# Patient Record
Sex: Female | Born: 1965
Health system: Southern US, Community
[De-identification: ages and names within clinical notes are randomized; demographics above are authoritative.]

---

## 2012-03-08 ENCOUNTER — Other Ambulatory Visit: Payer: Self-pay | Admitting: Internal Medicine

## 2012-03-08 DIAGNOSIS — Z1231 Encounter for screening mammogram for malignant neoplasm of breast: Secondary | ICD-10-CM

## 2012-04-24 ENCOUNTER — Ambulatory Visit
Admission: RE | Admit: 2012-04-24 | Discharge: 2012-04-24 | Disposition: A | Discharge status: Home / Self Care | Payer: Self-pay | Source: Ambulatory Visit | Attending: Internal Medicine | Admitting: Internal Medicine

## 2012-04-24 DIAGNOSIS — Z1231 Encounter for screening mammogram for malignant neoplasm of breast: Secondary | ICD-10-CM

## 2012-04-29 ENCOUNTER — Other Ambulatory Visit: Payer: Self-pay | Admitting: Internal Medicine

## 2012-04-29 DIAGNOSIS — R928 Other abnormal and inconclusive findings on diagnostic imaging of breast: Secondary | ICD-10-CM

## 2012-06-03 ENCOUNTER — Ambulatory Visit
Admission: RE | Admit: 2012-06-03 | Discharge: 2012-06-03 | Disposition: A | Discharge status: Home / Self Care | Payer: Commercial Managed Care - PPO | Source: Ambulatory Visit | Attending: Internal Medicine | Admitting: Internal Medicine

## 2012-06-03 ENCOUNTER — Other Ambulatory Visit: Payer: Self-pay | Admitting: Internal Medicine

## 2012-06-03 DIAGNOSIS — R928 Other abnormal and inconclusive findings on diagnostic imaging of breast: Secondary | ICD-10-CM

## 2012-10-04 ENCOUNTER — Other Ambulatory Visit: Payer: Self-pay | Admitting: Obstetrics and Gynecology

## 2012-10-04 ENCOUNTER — Other Ambulatory Visit (HOSPITAL_COMMUNITY)
Admission: RE | Admit: 2012-10-04 | Discharge: 2012-10-04 | Disposition: A | Discharge status: Home / Self Care | Payer: Commercial Managed Care - PPO | Source: Ambulatory Visit | Attending: Obstetrics and Gynecology | Admitting: Obstetrics and Gynecology

## 2012-10-04 DIAGNOSIS — Z1151 Encounter for screening for human papillomavirus (HPV): Secondary | ICD-10-CM | POA: Insufficient documentation

## 2012-10-04 DIAGNOSIS — Z01419 Encounter for gynecological examination (general) (routine) without abnormal findings: Secondary | ICD-10-CM | POA: Insufficient documentation

## 2013-06-27 ENCOUNTER — Other Ambulatory Visit: Payer: Self-pay

## 2013-06-27 DIAGNOSIS — Z1231 Encounter for screening mammogram for malignant neoplasm of breast: Secondary | ICD-10-CM

## 2013-07-21 ENCOUNTER — Ambulatory Visit
Admission: RE | Admit: 2013-07-21 | Discharge: 2013-07-21 | Disposition: A | Discharge status: Home / Self Care | Payer: Commercial Managed Care - PPO | Source: Ambulatory Visit

## 2013-07-21 DIAGNOSIS — Z1231 Encounter for screening mammogram for malignant neoplasm of breast: Secondary | ICD-10-CM

## 2013-12-30 ENCOUNTER — Other Ambulatory Visit: Payer: Self-pay | Admitting: Obstetrics and Gynecology

## 2013-12-30 ENCOUNTER — Other Ambulatory Visit (HOSPITAL_COMMUNITY)
Admission: RE | Admit: 2013-12-30 | Discharge: 2013-12-30 | Disposition: A | Discharge status: Home / Self Care | Payer: BC Managed Care – PPO | Source: Ambulatory Visit | Attending: Obstetrics and Gynecology | Admitting: Obstetrics and Gynecology

## 2013-12-30 DIAGNOSIS — Z01419 Encounter for gynecological examination (general) (routine) without abnormal findings: Secondary | ICD-10-CM | POA: Diagnosis not present

## 2013-12-31 LAB — CYTOLOGY - PAP

## 2014-01-08 ENCOUNTER — Ambulatory Visit
Admission: RE | Admit: 2014-01-08 | Discharge: 2014-01-08 | Disposition: A | Discharge status: Home / Self Care | Payer: BC Managed Care – PPO | Source: Ambulatory Visit | Attending: Internal Medicine | Admitting: Internal Medicine

## 2014-01-08 ENCOUNTER — Other Ambulatory Visit: Payer: Self-pay | Admitting: Internal Medicine

## 2014-01-08 DIAGNOSIS — M7989 Other specified soft tissue disorders: Secondary | ICD-10-CM

## 2014-07-17 ENCOUNTER — Other Ambulatory Visit: Payer: Self-pay

## 2014-07-17 DIAGNOSIS — Z1231 Encounter for screening mammogram for malignant neoplasm of breast: Secondary | ICD-10-CM

## 2014-08-26 ENCOUNTER — Ambulatory Visit
Admission: RE | Admit: 2014-08-26 | Discharge: 2014-08-26 | Disposition: A | Discharge status: Home / Self Care | Payer: BLUE CROSS/BLUE SHIELD | Source: Ambulatory Visit

## 2014-08-26 DIAGNOSIS — Z1231 Encounter for screening mammogram for malignant neoplasm of breast: Secondary | ICD-10-CM

## 2014-12-15 ENCOUNTER — Other Ambulatory Visit: Payer: Self-pay | Admitting: Obstetrics and Gynecology

## 2014-12-15 ENCOUNTER — Other Ambulatory Visit (HOSPITAL_COMMUNITY)
Admission: RE | Admit: 2014-12-15 | Discharge: 2014-12-15 | Disposition: A | Discharge status: Home / Self Care | Payer: BLUE CROSS/BLUE SHIELD | Source: Ambulatory Visit | Attending: Obstetrics and Gynecology | Admitting: Obstetrics and Gynecology

## 2014-12-15 DIAGNOSIS — Z01419 Encounter for gynecological examination (general) (routine) without abnormal findings: Secondary | ICD-10-CM | POA: Insufficient documentation

## 2014-12-17 LAB — CYTOLOGY - PAP

## 2015-07-29 ENCOUNTER — Other Ambulatory Visit: Payer: Self-pay

## 2015-07-29 ENCOUNTER — Other Ambulatory Visit (HOSPITAL_BASED_OUTPATIENT_CLINIC_OR_DEPARTMENT_OTHER): Payer: Self-pay

## 2015-07-29 DIAGNOSIS — Z1231 Encounter for screening mammogram for malignant neoplasm of breast: Secondary | ICD-10-CM

## 2015-09-14 ENCOUNTER — Ambulatory Visit
Admission: RE | Admit: 2015-09-14 | Discharge: 2015-09-14 | Disposition: A | Discharge status: Home / Self Care | Payer: BLUE CROSS/BLUE SHIELD | Source: Ambulatory Visit

## 2015-09-14 DIAGNOSIS — Z1231 Encounter for screening mammogram for malignant neoplasm of breast: Secondary | ICD-10-CM

## 2015-11-03 ENCOUNTER — Ambulatory Visit
Admission: RE | Admit: 2015-11-03 | Discharge: 2015-11-03 | Disposition: A | Discharge status: Home / Self Care | Payer: BLUE CROSS/BLUE SHIELD | Source: Ambulatory Visit | Attending: Internal Medicine | Admitting: Internal Medicine

## 2015-11-03 ENCOUNTER — Other Ambulatory Visit: Payer: Self-pay | Admitting: Internal Medicine

## 2015-11-03 DIAGNOSIS — M25552 Pain in left hip: Secondary | ICD-10-CM

## 2015-11-05 ENCOUNTER — Ambulatory Visit
Admission: RE | Admit: 2015-11-05 | Discharge: 2015-11-05 | Disposition: A | Discharge status: Home / Self Care | Payer: BLUE CROSS/BLUE SHIELD | Source: Ambulatory Visit | Attending: Internal Medicine | Admitting: Internal Medicine

## 2015-11-05 ENCOUNTER — Other Ambulatory Visit: Payer: Self-pay | Admitting: Internal Medicine

## 2015-11-05 DIAGNOSIS — M25552 Pain in left hip: Secondary | ICD-10-CM

## 2015-12-17 ENCOUNTER — Other Ambulatory Visit: Payer: Self-pay | Admitting: Obstetrics and Gynecology

## 2015-12-17 ENCOUNTER — Other Ambulatory Visit (HOSPITAL_COMMUNITY)
Admission: RE | Admit: 2015-12-17 | Discharge: 2015-12-17 | Disposition: A | Discharge status: Home / Self Care | Payer: BLUE CROSS/BLUE SHIELD | Source: Ambulatory Visit | Attending: Obstetrics and Gynecology | Admitting: Obstetrics and Gynecology

## 2015-12-17 DIAGNOSIS — Z1151 Encounter for screening for human papillomavirus (HPV): Secondary | ICD-10-CM | POA: Insufficient documentation

## 2015-12-17 DIAGNOSIS — Z01419 Encounter for gynecological examination (general) (routine) without abnormal findings: Secondary | ICD-10-CM | POA: Insufficient documentation

## 2015-12-21 LAB — CYTOLOGY - PAP

## 2016-09-13 ENCOUNTER — Ambulatory Visit: Payer: BLUE CROSS/BLUE SHIELD

## 2016-09-13 ENCOUNTER — Other Ambulatory Visit: Payer: Self-pay | Admitting: Internal Medicine

## 2016-09-13 DIAGNOSIS — Z1231 Encounter for screening mammogram for malignant neoplasm of breast: Secondary | ICD-10-CM

## 2016-09-14 ENCOUNTER — Ambulatory Visit
Admission: RE | Admit: 2016-09-14 | Discharge: 2016-09-14 | Disposition: A | Discharge status: Home / Self Care | Payer: BLUE CROSS/BLUE SHIELD | Source: Ambulatory Visit | Attending: Internal Medicine | Admitting: Internal Medicine

## 2016-09-14 DIAGNOSIS — Z1231 Encounter for screening mammogram for malignant neoplasm of breast: Secondary | ICD-10-CM

## 2016-09-18 ENCOUNTER — Other Ambulatory Visit: Payer: Self-pay | Admitting: Internal Medicine

## 2016-09-18 DIAGNOSIS — R928 Other abnormal and inconclusive findings on diagnostic imaging of breast: Secondary | ICD-10-CM

## 2016-09-26 ENCOUNTER — Other Ambulatory Visit: Payer: Self-pay | Admitting: Obstetrics and Gynecology

## 2016-09-26 DIAGNOSIS — R928 Other abnormal and inconclusive findings on diagnostic imaging of breast: Secondary | ICD-10-CM

## 2016-09-28 ENCOUNTER — Other Ambulatory Visit: Payer: BLUE CROSS/BLUE SHIELD

## 2016-10-05 ENCOUNTER — Ambulatory Visit (HOSPITAL_COMMUNITY)
Admission: RE | Admit: 2016-10-05 | Discharge: 2016-10-05 | Disposition: A | Discharge status: Home / Self Care | Payer: Self-pay | Source: Ambulatory Visit | Attending: Obstetrics and Gynecology | Admitting: Obstetrics and Gynecology

## 2016-10-05 ENCOUNTER — Encounter (HOSPITAL_COMMUNITY): Payer: Self-pay | Admitting: *Deleted

## 2016-10-05 ENCOUNTER — Ambulatory Visit
Admission: RE | Admit: 2016-10-05 | Discharge: 2016-10-05 | Disposition: A | Discharge status: Home / Self Care | Payer: No Typology Code available for payment source | Source: Ambulatory Visit | Attending: Obstetrics and Gynecology | Admitting: Obstetrics and Gynecology

## 2016-10-05 VITALS — BP 114/72 | Ht 66.0 in | Wt 147.2 lb

## 2016-10-05 DIAGNOSIS — R928 Other abnormal and inconclusive findings on diagnostic imaging of breast: Secondary | ICD-10-CM

## 2016-10-05 DIAGNOSIS — Z1239 Encounter for other screening for malignant neoplasm of breast: Secondary | ICD-10-CM

## 2016-10-05 NOTE — Patient Instructions (Addendum)
Explained breast self awareness with Kristen Castro. Patient did not need a Pap smear today due to last Pap smear and HPV typing was 12/17/2015. Let her know BCCCP will cover Pap smears and HPV typing every 5 years unless has a history of abnormal Pap smears. [facility] for diagnostic mammogram and possible left breast ultrasound per recommendation. Appointment scheduled for Thursday, October 05, 2016 at 1410. Discussed smoking cessation with patient. Referred [facility] to the [facility] and gave resources to the free smoking cessation classes at [facility]. Kristen Castro verbalized understanding.  Brannock, Kathaleen Maserhristine Poll, RN 1:27 PM

## 2016-10-05 NOTE — Addendum Note (Signed)
Encounter addended by: Priscille HeidelbergBrannock, Marlyne Totaro P, RN on: 10/05/2016  1:32 PM<BR>    Actions taken: Sign clinical note

## 2016-10-05 NOTE — Progress Notes (Signed)
[facility] due to recommending additional imaging of the left breast. Screening mammogram completed 09/14/2016.  Pap Smear: Pap smear not completed today. Last Pap smear was 12/17/2015 at [facility] and normal with negative HPV. Per patient has a history of an abnormal Pap smear when she was 51 years old that a colposcopy was completed for follow. Per patient all Pap smears have been normal since colposcopy. Last four Pap smear results are in EPIC.  Physical exam: Breasts Breasts symmetrical. No skin abnormalities bilateral breasts. No nipple retraction bilateral breasts. No nipple discharge bilateral breasts. No lymphadenopathy. No lumps palpated bilateral breasts. No complaints of pain or tenderness on exam. [facility] for diagnostic mammogram and possible left breast ultrasound per recommendation. Appointment scheduled for Thursday, October 05, 2016 at 1410.        Pelvic/Bimanual No Pap smear completed today since last Pap smear and HPV typing was 12/17/2015. Pap smear not indicated per BCCCP guidelines.   Smoking History: Patient is a current smoker. Discussed smoking cessation with patient. [facility] and gave resources to the free smoking cessation classes at [facility].  Patient Navigation: Patient education provided. Access to services provided for patient through [facility] program.

## 2016-10-06 ENCOUNTER — Encounter (HOSPITAL_COMMUNITY): Payer: Self-pay | Admitting: *Deleted

## 2017-09-26 ENCOUNTER — Other Ambulatory Visit: Payer: Self-pay | Admitting: Internal Medicine

## 2017-09-26 DIAGNOSIS — Z1231 Encounter for screening mammogram for malignant neoplasm of breast: Secondary | ICD-10-CM

## 2017-09-28 DIAGNOSIS — Z0000 Encounter for general adult medical examination without abnormal findings: Secondary | ICD-10-CM | POA: Diagnosis not present

## 2017-09-28 DIAGNOSIS — Z13220 Encounter for screening for lipoid disorders: Secondary | ICD-10-CM | POA: Diagnosis not present

## 2017-10-17 DIAGNOSIS — Z01411 Encounter for gynecological examination (general) (routine) with abnormal findings: Secondary | ICD-10-CM | POA: Diagnosis not present

## 2017-10-17 DIAGNOSIS — N939 Abnormal uterine and vaginal bleeding, unspecified: Secondary | ICD-10-CM | POA: Diagnosis not present

## 2017-10-22 ENCOUNTER — Ambulatory Visit
Admission: RE | Admit: 2017-10-22 | Discharge: 2017-10-22 | Disposition: A | Discharge status: Home / Self Care | Payer: 59 | Source: Ambulatory Visit | Attending: Internal Medicine | Admitting: Internal Medicine

## 2017-10-22 DIAGNOSIS — Z1231 Encounter for screening mammogram for malignant neoplasm of breast: Secondary | ICD-10-CM | POA: Diagnosis not present

## 2017-11-02 DIAGNOSIS — R030 Elevated blood-pressure reading, without diagnosis of hypertension: Secondary | ICD-10-CM | POA: Diagnosis not present

## 2017-11-02 DIAGNOSIS — L409 Psoriasis, unspecified: Secondary | ICD-10-CM | POA: Diagnosis not present

## 2017-11-06 DIAGNOSIS — N939 Abnormal uterine and vaginal bleeding, unspecified: Secondary | ICD-10-CM | POA: Diagnosis not present

## 2017-11-22 DIAGNOSIS — Z1211 Encounter for screening for malignant neoplasm of colon: Secondary | ICD-10-CM | POA: Diagnosis not present

## 2018-02-20 DIAGNOSIS — L400 Psoriasis vulgaris: Secondary | ICD-10-CM | POA: Diagnosis not present

## 2018-03-18 DIAGNOSIS — K52832 Lymphocytic colitis: Secondary | ICD-10-CM | POA: Diagnosis not present

## 2018-03-18 DIAGNOSIS — K6282 Dysplasia of anus: Secondary | ICD-10-CM | POA: Diagnosis not present

## 2018-03-18 DIAGNOSIS — Z1211 Encounter for screening for malignant neoplasm of colon: Secondary | ICD-10-CM | POA: Diagnosis not present

## 2018-03-18 DIAGNOSIS — K6289 Other specified diseases of anus and rectum: Secondary | ICD-10-CM | POA: Diagnosis not present

## 2018-05-03 DIAGNOSIS — K52832 Lymphocytic colitis: Secondary | ICD-10-CM | POA: Diagnosis not present

## 2018-05-03 DIAGNOSIS — K580 Irritable bowel syndrome with diarrhea: Secondary | ICD-10-CM | POA: Diagnosis not present

## 2018-05-03 DIAGNOSIS — D013 Carcinoma in situ of anus and anal canal: Secondary | ICD-10-CM | POA: Diagnosis not present

## 2018-05-13 IMAGING — CR DG FEMUR 2+V*L*
4 series · 4 of 4 positions shown · non-contrast
Comparison: Left hip x-rays 11/03/2015.

CLINICAL DATA: 49-year-old with lateral left hip pain after
sleeping on the left side last night.

EXAM:
LEFT FEMUR 2 VIEWS

[t femur with hip  ap left]
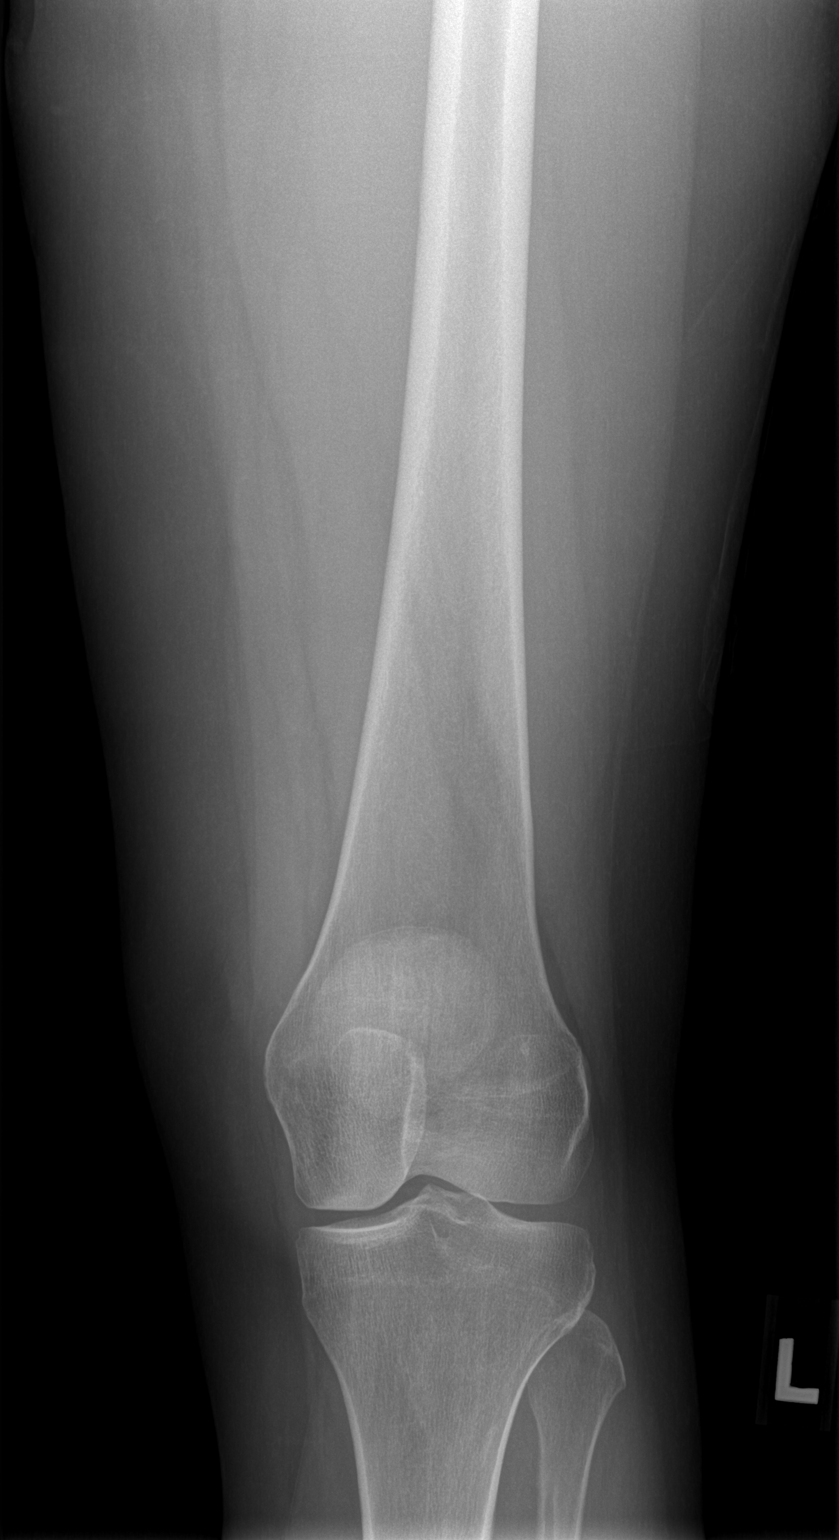

[t femur with knee ap left]
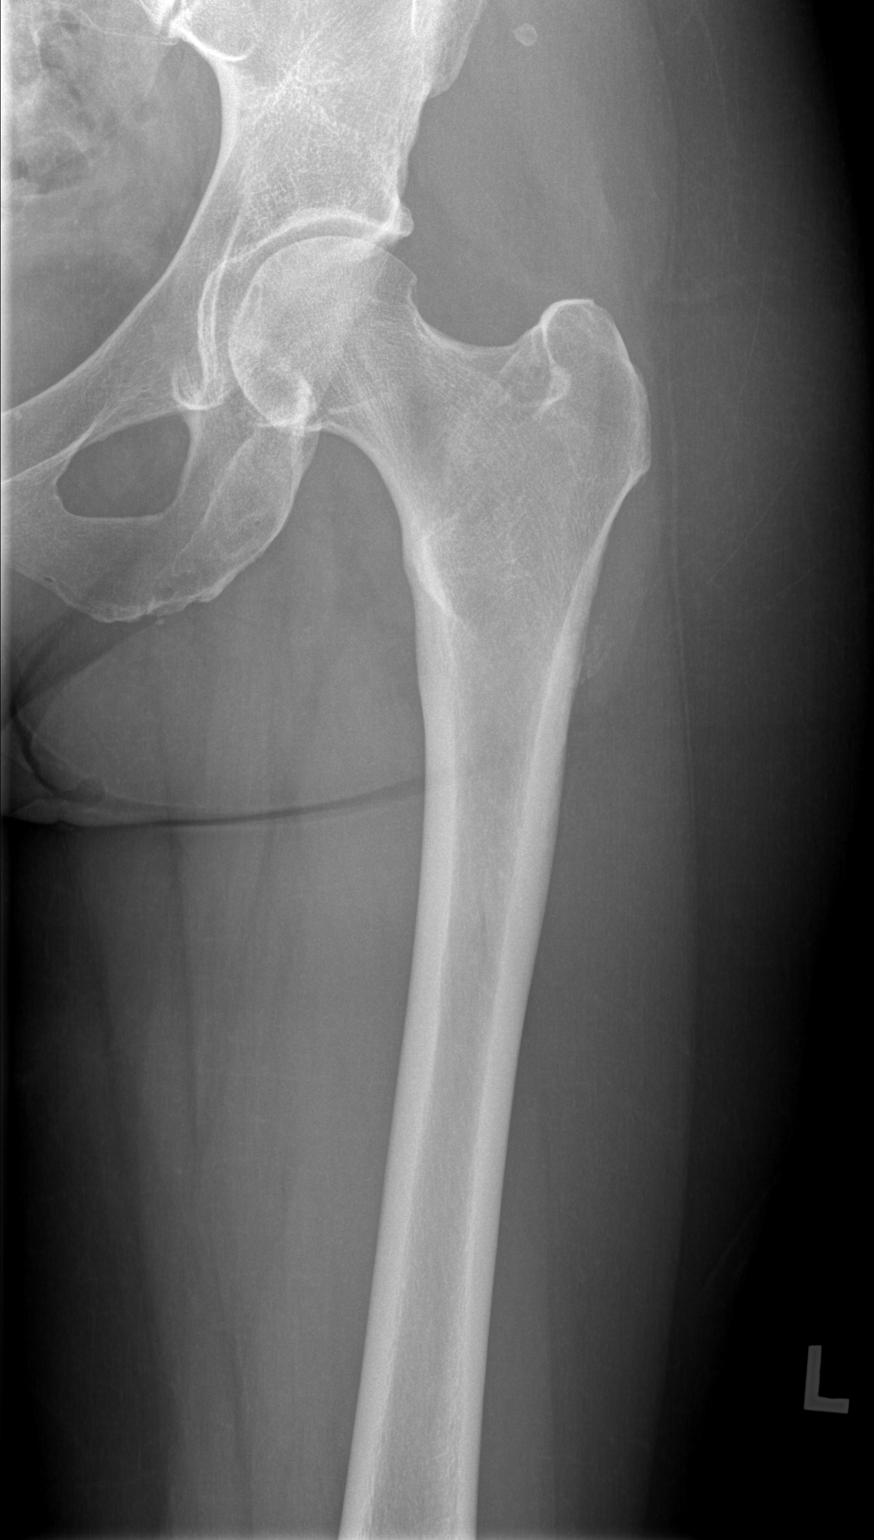

[t femur with hip lat left]
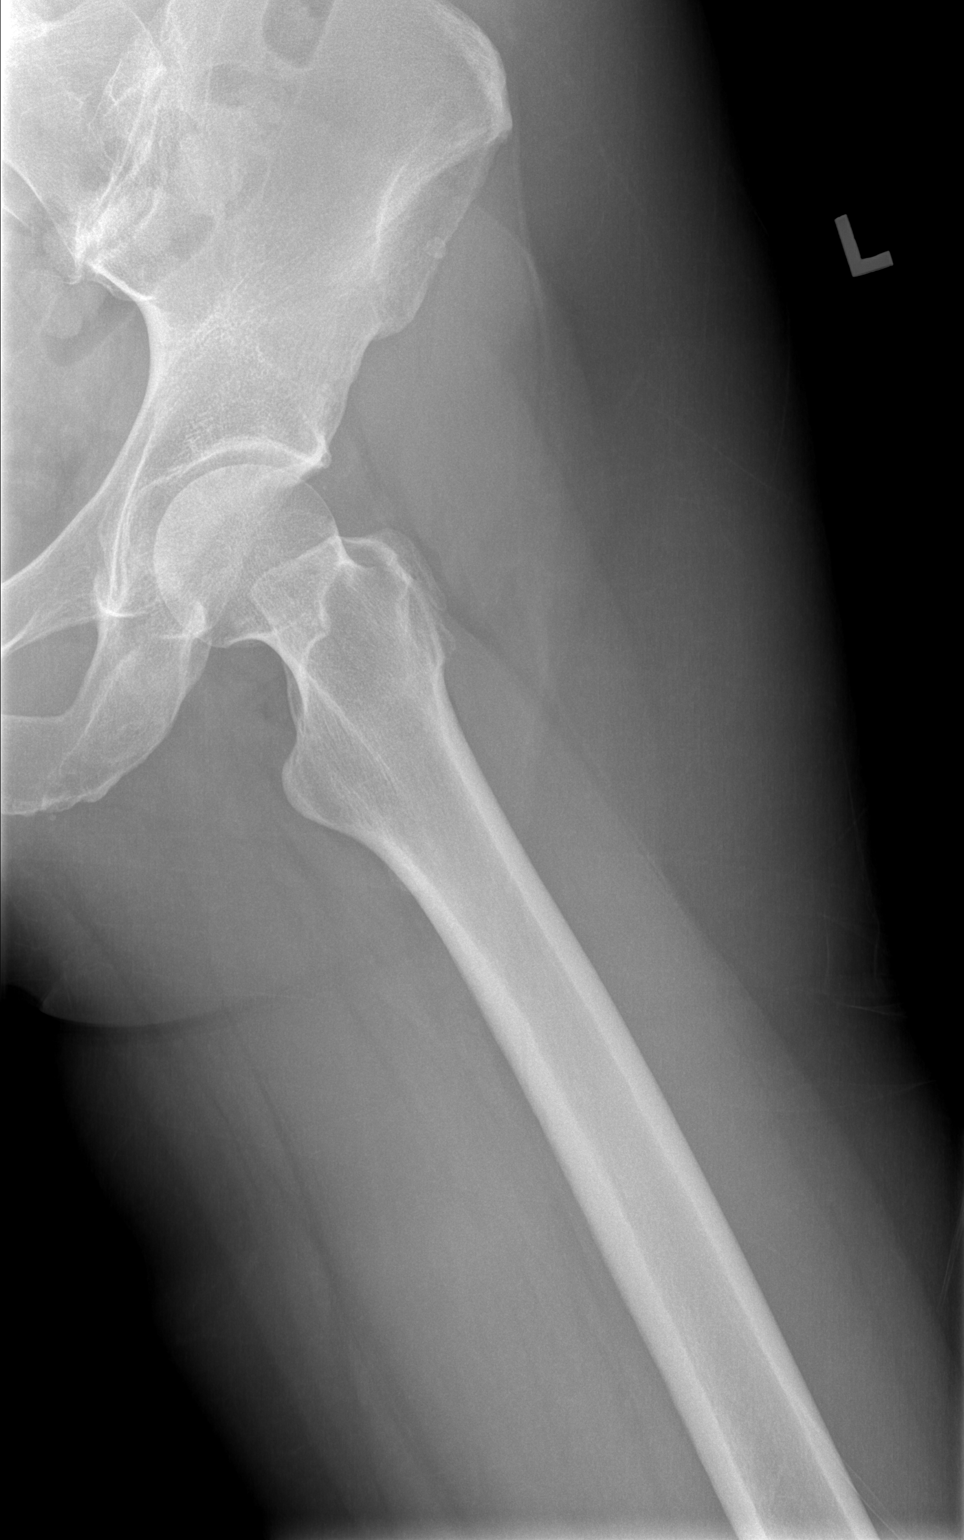

[t femur with knee lat left]
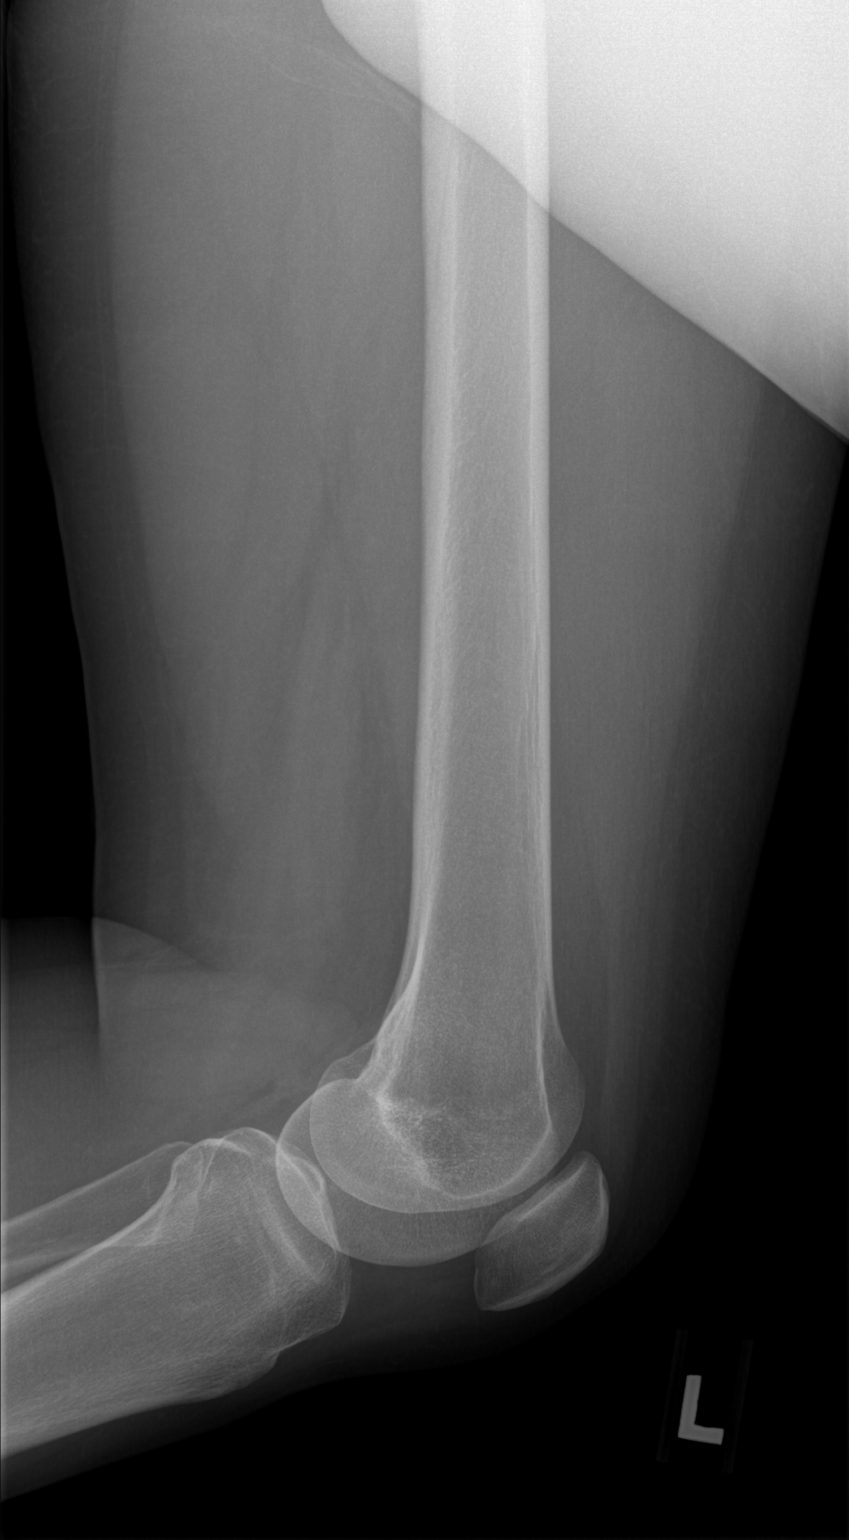

[4 of 4 positions shown; findings below may reference images not displayed]

FINDINGS: The vague calcification adjacent to or arising from the
anterolateral aspect of the proximal femoral metaphysis are less
apparent on today's examination when compared to the hip x-rays 2
days ago, but persist. No other intrinsic osseous abnormalities
involving the femur. Hip joint and knee joint intact.
IMPRESSION: 1. No acute or subacute osseous abnormality.
2. Likely hydroxyapatite deposition disease involving a
musculotendinous insertion on the proximal femur accounting for the
finding identified on the left hip x-ray 2 days ago.

## 2018-05-27 ENCOUNTER — Ambulatory Visit: Payer: Self-pay | Admitting: Surgery

## 2018-05-27 DIAGNOSIS — K6282 Dysplasia of anus: Secondary | ICD-10-CM | POA: Diagnosis not present

## 2018-05-27 NOTE — H&P (Signed)
Kristen Castro Documented: 05/27/2018 11:41 AM Location: [facility] Patient #: [id number] DOB: [birth year] Married / Language: [place] / Race: White Female  History of Present Illness Kristen Castro; 05/27/2018 4:42 PM) The patient is a 52 year old female who presents with anal lesions. Note for "Anal lesions": ` ` ` Patient sent for surgical consultation at the request of Dr Marca Ancona  Chief Complaint: Anal papillae lesions. 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 Nonbleeding internal hemorrhoids noted. Some hypertrophic anal papillae biopsy. Findings concerning for AIN 1/condyloma. Surgical consultation requested. Patient doesn't use her bowels about every other day. Some concern perhaps that she had morbid IBS diarrhea component but that is shifted more towards constipation. Take sclerotic and sometimes a fiber supplement. She is adopted so she does not know her family history. She's never had prior abdominal surgeries. History of prior count condyloma. She smokes a couple packs of cigarettes in a week. Not diabetic. Not on steroids. Can walk at least a half hour without difficulty.  No personal nor family history of GI/colon cancer, inflammatory bowel disease, irritable bowel syndrome, allergy such as Celiac Sprue, dietary/dairy problems, colitis, ulcers nor gastritis. No recent sick contacts/gastroenteritis. No travel outside the country. No changes in diet. No dysphagia to solids or liquids. No significant heartburn or reflux. No hematochezia, hematemesis, coffee ground emesis. No evidence of prior gastric/peptic ulceration.  (Review of systems as stated in this history (HPI) or in the review of systems. Otherwise all other 12 point ROS are negative) ` ` `   Past Surgical History Kristen Castro, Castro; 05/27/2018 11:42 AM) No pertinent past surgical  history  Diagnostic Studies History Kristen Castro, Castro; 05/27/2018 11:42 AM) Colonoscopy within last year Mammogram within last year Pap Smear 1-5 years ago  Allergies Kristen Castro, Castro; 05/27/2018 3:38 PM) No Known Drug Allergies [05/27/2018]:  Medication History (Kristen Castro, Castro; 05/27/2018 3:39 PM) amLODIPine Besylate (5MG  Tablet, Oral) Active. Medications Reconciled  Social History Kristen Castro, Castro; 05/27/2018 11:42 AM) Alcohol use Moderate alcohol use. No drug use Tobacco use Current every day smoker.  Family History Kristen Castro, [place]; 05/27/2018 11:42 AM) Family history unknown First Degree Relatives  Pregnancy / Birth History Kristen Castro, [place]; 05/27/2018 11:42 AM) Age at menarche 17 years. Contraceptive History Oral contraceptives. Gravida 2 Maternal age 40-30 Para 0 Regular periods  Other Problems Kristen Castro, Castro; 05/27/2018 11:42 AM) Anxiety Disorder High blood pressure     Review of Systems (Kristen Castro Castro; 05/27/2018 11:42 AM) General Not Present- Appetite Loss, Chills, Fatigue, Fever, Night Sweats, Weight Gain and Weight Loss. Skin Not Present- Change in Wart/Mole, Dryness, Hives, Jaundice, New Lesions, Non-Healing Wounds, Rash and Ulcer. HEENT Present- Nose Bleed, Ringing in the Ears, Sore Throat and Wears glasses/contact lenses. Not Present- Earache, Hearing Loss, Hoarseness, Oral Ulcers, Seasonal Allergies, Sinus Pain, Visual Disturbances and Yellow Eyes. Gastrointestinal Present- Bloating. Not Present- Abdominal Pain, Bloody Stool, Change in Bowel Habits, Chronic diarrhea, Constipation, Difficulty Swallowing, Excessive gas, Gets full quickly at meals, Hemorrhoids, Indigestion, Nausea, Rectal Pain and Vomiting.  Vitals (Kristen Castro; 05/27/2018 3:38 PM) 05/27/2018 3:38 PM Weight: 143 lb Height: 66in Body Surface Area: 1.73 m Body Mass Index: 23.08 kg/m  Temp.: 98.3F(Oral)  Pulse: 102 (Regular)   BP: 132/78 (Sitting, Left Arm, Standard)      Physical Exam Kristen Castro; 05/27/2018 4:40 PM)  General Mental Status-Alert. General Appearance-Not in  acute distress, Not Sickly. Orientation-Oriented X3. Hydration-Well hydrated. Voice-Normal.  Integumentary Global Assessment Upon inspection and palpation of skin surfaces of the - Axillae: non-tender, no inflammation or ulceration, no drainage. and Distribution of scalp and body hair is normal. General Characteristics Temperature - normal warmth is noted.  Head and Neck Head-normocephalic, atraumatic with no lesions or palpable masses. Face Global Assessment - atraumatic, no absence of expression. Neck Global Assessment - no abnormal movements, no bruit auscultated on the right, no bruit auscultated on the left, no decreased range of motion, non-tender. Trachea-midline. Thyroid Gland Characteristics - non-tender.  Eye Eyeball - Left-Extraocular movements intact, No Nystagmus. Eyeball - Right-Extraocular movements intact, No Nystagmus. Cornea - Left-No Hazy. Cornea - Right-No Hazy. Sclera/Conjunctiva - Left-No scleral icterus, No Discharge. Sclera/Conjunctiva - Right-No scleral icterus, No Discharge. Pupil - Left-Direct reaction to light normal. Pupil - Right-Direct reaction to light normal.  ENMT Ears Pinna - Left - no drainage observed, no generalized tenderness observed. Right - no drainage observed, no generalized tenderness observed. Nose and Sinuses External Inspection of the Nose - no destructive lesion observed. Inspection of the nares - Left - quiet respiration. Right - quiet respiration. Mouth and Throat Lips - Upper Lip - no fissures observed, no pallor noted. Lower Lip - no fissures observed, no pallor noted. Nasopharynx - no discharge present. Oral Cavity/Oropharynx - Tongue - no dryness observed. Oral Mucosa - no cyanosis observed. Hypopharynx - no evidence of airway  distress observed.  Chest and Lung Exam Inspection Movements - Normal and Symmetrical. Accessory muscles - No use of accessory muscles in breathing. Palpation Palpation of the chest reveals - Non-tender. Auscultation Breath sounds - Normal and Clear.  Cardiovascular Auscultation Rhythm - Regular. Murmurs & Other Heart Sounds - Auscultation of the heart reveals - No Murmurs and No Systolic Clicks.  Abdomen Inspection Inspection of the abdomen reveals - No Visible peristalsis and No Abnormal pulsations. Umbilicus - No Bleeding, No Urine drainage. Palpation/Percussion Palpation and Percussion of the abdomen reveal - Soft, Non Tender, No Rebound tenderness, No Rigidity (guarding) and No Cutaneous hyperesthesia. Note: Abdomen soft. Not severely distended. No distasis recti. No umbilical or other anterior abdominal wall hernias  Female Genitourinary Sexual Maturity Tanner 5 - Adult hair pattern. Note: No vaginal bleeding nor discharge  Rectal Note: Perianal skin clear with minimal irritation/pruritus. No external condyloma or hemorrhoids. Normal sphincter tone. No fissure. No fistula. No pilonidal disease.  Tolerates digital and anoscopic exam but rather sensitive. 2 obvious warts along the left lateral anal crypts and a smaller one on the right anterior anal crypt as well. Grade 2 right anterior internal hemorrhoid. Right posterior and left lateral more grade 1. No major proctitis.  Peripheral Vascular Upper Extremity Inspection - Left - No Cyanotic nailbeds, Not Ischemic. Right - No Cyanotic nailbeds, Not Ischemic.  Neurologic Neurologic evaluation reveals -normal attention span and ability to concentrate, able to name objects and repeat phrases. Appropriate fund of knowledge , normal sensation and normal coordination. Mental Status Affect - not angry, not paranoid. Cranial Nerves-Normal Bilaterally. Gait-Normal.  Neuropsychiatric Mental status exam  performed with findings of-able to articulate well with normal speech/language, rate, volume and coherence, thought content normal with ability to perform basic computations and apply abstract reasoning and no evidence of hallucinations, delusions, obsessions or homicidal/suicidal ideation.  Musculoskeletal Global Assessment Spine, Ribs and Pelvis - no instability, subluxation or laxity. Right Upper Extremity - no instability, subluxation or laxity.  Lymphatic Head & Neck  General Head & Neck  Lymphatics: Bilateral - Description - No Localized lymphadenopathy. Axillary  General Axillary Region: Bilateral - Description - No Localized lymphadenopathy. Femoral & Inguinal  Generalized Femoral & Inguinal Lymphatics: Left - Description - No Localized lymphadenopathy. Right - Description - No Localized lymphadenopathy.   Results Kristen Castro; 05/27/2018 4:42 PM) Procedures  Name Value Date Hemorrhoids Procedure Internal exam: Mass Other: Perianal skin clear with minimal irritation/pruritus. No external condyloma or hemorrhoids. Normal sphincter tone. No fissure. No fistula. No pilonidal disease............Marland KitchenTolerates digital and anoscopic exam but rather sensitive. 2 obvious warts along the left lateral anal crypts and a smaller one on the right anterior anal crypt as well. Grade 2 right anterior internal hemorrhoid. Right posterior and left lateral more grade 1. No major proctitis.  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  I think she would benefit from removal/ablation. Offered to try do cryoablation with anoscopy in the office today but she is rather sensitive. She rather do this with her asleep. Allow me to do a more complete examination. This is very busy time of year for her, so she would like to wait  until the late spring. That is not unreasonable.  Would recommend follow-up per usual protocol  Current Plans ANOSCOPY, DIAGNOSTIC (29244) Pt Education - [facility] Anal Warts (Lyla Jasek) The anatomy & physiology of the anorectal region was discussed. The pathophysiology of anorectal warts and differential diagnosis was discussed. Natural history risks without surgery was discussed such as further growth and cancer. I stressed the importance of office follow-up to catch early recurrence & minimize/halt progression of disease. Interventions such as cauterization or cryotherapy by topical agents were discussed.  The patient's symptoms are not adequately controlled by non-operative treatments. I feel the risks & problems of no surgery outweigh the operative risks; therefore, I recommended surgery to treat the anal warts by removal, ablation and/or cauterization.  Risks such as bleeding, infection, need for further treatment, heart attack, death, and other risks were discussed. I noted a good likelihood this will help address the problem. Goals of post-operative recovery were discussed as well. Possibility that this will not correct all symptoms was explained. Post-operative pain, bleeding, constipation, and other problems after surgery were discussed. We will work to minimize complications. Educational handouts further explaining the pathology, treatment options, and bowel regimen were given as well. Questions were answered. The patient expresses understanding & wishes to proceed with surgery.  Surgical (Rectal exam) follow-up after resection of condyloma acuminatum (warts): - every 3 months until negative exam x1, then - every 6 months until negative exan x 1, then - every Year until negative exam x1, then  - as needed thereafter   ENCOUNTER FOR PREOPERATIVE EXAMINATION FOR GENERAL SURGICAL PROCEDURE (Z01.818)  Current Plans You are being scheduled for surgery- Our  schedulers will call you.  You should hear from our office's scheduling department within 5 working days about the location, date, and time of surgery. We try to make accommodations for patient's preferences in scheduling surgery, but sometimes the OR schedule or the surgeon's schedule prevents [place] from making those accommodations.  If you have not heard from our office [phone]) in 5 working days, call the office and ask for your surgeon's nurse.  If you have other questions about your diagnosis, plan, or surgery, call the office and ask for your surgeon's nurse.  Pt Education - [facility] Rectal Prep for Anorectal outpatient/office surgery: discussed with patient and provided information. Pt Education - [facility] Rectal Surgery HCI (Jester Klingberg): discussed with patient and provided information. Pt Education - [facility] Good Bowel Health (Darlyne Schmiesing)  Kristen SportsmanSteven C. Caia Lofaro, Castro, FACS, MASCRS Gastrointestinal and Minimally Invasive Surgery    [address] [place], [place] [id number] [phone] / Paging [phone] Fax

## 2018-05-30 DIAGNOSIS — R030 Elevated blood-pressure reading, without diagnosis of hypertension: Secondary | ICD-10-CM | POA: Diagnosis not present

## 2018-05-30 DIAGNOSIS — J0100 Acute maxillary sinusitis, unspecified: Secondary | ICD-10-CM | POA: Diagnosis not present

## 2018-05-30 DIAGNOSIS — J029 Acute pharyngitis, unspecified: Secondary | ICD-10-CM | POA: Diagnosis not present

## 2018-09-02 DIAGNOSIS — N915 Oligomenorrhea, unspecified: Secondary | ICD-10-CM | POA: Diagnosis not present

## 2018-09-02 DIAGNOSIS — R232 Flushing: Secondary | ICD-10-CM | POA: Diagnosis not present

## 2018-09-23 ENCOUNTER — Other Ambulatory Visit: Payer: Self-pay

## 2018-09-23 ENCOUNTER — Encounter (HOSPITAL_COMMUNITY): Payer: Self-pay | Admitting: Emergency Medicine

## 2018-09-23 ENCOUNTER — Emergency Department (HOSPITAL_COMMUNITY)
Admission: EM | Admit: 2018-09-23 | Discharge: 2018-09-23 | Disposition: A | Discharge status: Home / Self Care | Payer: 59 | Attending: Emergency Medicine | Admitting: Emergency Medicine

## 2018-09-23 ENCOUNTER — Emergency Department (HOSPITAL_COMMUNITY): Payer: 59

## 2018-09-23 DIAGNOSIS — Y9355 Activity, bike riding: Secondary | ICD-10-CM | POA: Diagnosis not present

## 2018-09-23 DIAGNOSIS — S93402A Sprain of unspecified ligament of left ankle, initial encounter: Secondary | ICD-10-CM | POA: Diagnosis not present

## 2018-09-23 DIAGNOSIS — F17210 Nicotine dependence, cigarettes, uncomplicated: Secondary | ICD-10-CM | POA: Diagnosis not present

## 2018-09-23 DIAGNOSIS — Y999 Unspecified external cause status: Secondary | ICD-10-CM | POA: Diagnosis not present

## 2018-09-23 DIAGNOSIS — Y929 Unspecified place or not applicable: Secondary | ICD-10-CM | POA: Diagnosis not present

## 2018-09-23 DIAGNOSIS — S99912A Unspecified injury of left ankle, initial encounter: Secondary | ICD-10-CM | POA: Diagnosis present

## 2018-09-23 DIAGNOSIS — Z79899 Other long term (current) drug therapy: Secondary | ICD-10-CM | POA: Diagnosis not present
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
## 2018-10-22 ENCOUNTER — Other Ambulatory Visit: Payer: Self-pay | Admitting: Internal Medicine

## 2018-10-22 DIAGNOSIS — Z1231 Encounter for screening mammogram for malignant neoplasm of breast: Secondary | ICD-10-CM

## 2018-12-04 ENCOUNTER — Ambulatory Visit: Payer: 59

## 2018-12-09 ENCOUNTER — Other Ambulatory Visit: Payer: Self-pay

## 2018-12-09 ENCOUNTER — Ambulatory Visit
Admission: RE | Admit: 2018-12-09 | Discharge: 2018-12-09 | Disposition: A | Discharge status: Home / Self Care | Payer: 59 | Source: Ambulatory Visit | Attending: Internal Medicine | Admitting: Internal Medicine

## 2018-12-09 DIAGNOSIS — Z1231 Encounter for screening mammogram for malignant neoplasm of breast: Secondary | ICD-10-CM

## 2018-12-27 ENCOUNTER — Other Ambulatory Visit: Payer: Self-pay

## 2018-12-27 ENCOUNTER — Other Ambulatory Visit (HOSPITAL_COMMUNITY)
Admission: RE | Admit: 2018-12-27 | Discharge: 2018-12-27 | Disposition: A | Discharge status: Home / Self Care | Payer: 59 | Source: Ambulatory Visit | Attending: Obstetrics and Gynecology | Admitting: Obstetrics and Gynecology

## 2018-12-27 DIAGNOSIS — Z01419 Encounter for gynecological examination (general) (routine) without abnormal findings: Secondary | ICD-10-CM | POA: Diagnosis not present

## 2019-01-02 LAB — CYTOLOGY - PAP
Diagnosis: NEGATIVE
HPV: NOT DETECTED

## 2019-02-21 ENCOUNTER — Other Ambulatory Visit: Payer: Self-pay | Admitting: Surgery

## 2022-03-13 ENCOUNTER — Other Ambulatory Visit: Payer: Self-pay | Admitting: Obstetrics and Gynecology

## 2022-03-13 ENCOUNTER — Other Ambulatory Visit (HOSPITAL_COMMUNITY)
Admission: RE | Admit: 2022-03-13 | Discharge: 2022-03-13 | Disposition: A | Discharge status: Home / Self Care | Payer: BC Managed Care – PPO | Source: Ambulatory Visit | Attending: Obstetrics and Gynecology | Admitting: Obstetrics and Gynecology

## 2022-03-13 DIAGNOSIS — Z01419 Encounter for gynecological examination (general) (routine) without abnormal findings: Secondary | ICD-10-CM | POA: Diagnosis present

## 2022-03-15 LAB — CYTOLOGY - PAP
Comment: NEGATIVE
Diagnosis: NEGATIVE
High risk HPV: NEGATIVE
